# Patient Record
Sex: Female | Born: 1970 | Race: White | Hispanic: No | Marital: Married | Smoking: Never smoker
Health system: Southern US, Community
[De-identification: ages and names within clinical notes are randomized; demographics above are authoritative.]

---

## 1998-01-13 ENCOUNTER — Ambulatory Visit (HOSPITAL_COMMUNITY): Admission: RE | Admit: 1998-01-13 | Discharge: 1998-01-13 | Payer: Self-pay | Admitting: Obstetrics and Gynecology

## 1998-05-19 ENCOUNTER — Inpatient Hospital Stay (HOSPITAL_COMMUNITY): Admission: AD | Admit: 1998-05-19 | Discharge: 1998-05-21 | Payer: Self-pay | Admitting: Obstetrics and Gynecology

## 1998-05-22 ENCOUNTER — Encounter (HOSPITAL_COMMUNITY): Admission: RE | Admit: 1998-05-22 | Discharge: 1998-08-20 | Payer: Self-pay | Admitting: Obstetrics and Gynecology

## 1999-11-10 ENCOUNTER — Other Ambulatory Visit: Admission: RE | Admit: 1999-11-10 | Discharge: 1999-11-10 | Payer: Self-pay | Admitting: *Deleted

## 2001-02-09 ENCOUNTER — Other Ambulatory Visit: Admission: RE | Admit: 2001-02-09 | Discharge: 2001-02-09 | Payer: Self-pay | Admitting: Obstetrics and Gynecology

## 2009-07-14 ENCOUNTER — Ambulatory Visit: Payer: Self-pay | Admitting: Diagnostic Radiology

## 2009-07-14 ENCOUNTER — Ambulatory Visit (HOSPITAL_BASED_OUTPATIENT_CLINIC_OR_DEPARTMENT_OTHER): Admission: RE | Admit: 2009-07-14 | Discharge: 2009-07-14 | Payer: Self-pay | Admitting: Family Medicine

## 2010-04-12 IMAGING — US US ABDOMEN COMPLETE
1 series · 14 of 25 positions shown · non-contrast
Comparison: None

CLINICAL DATA: Right upper quadrant pain.

COMPLETE ABDOMINAL ULTRASOUND

[Series 1: us abdomen complete · 0.32mm/px · 14 of 79 slices shown]
[im 1/79]
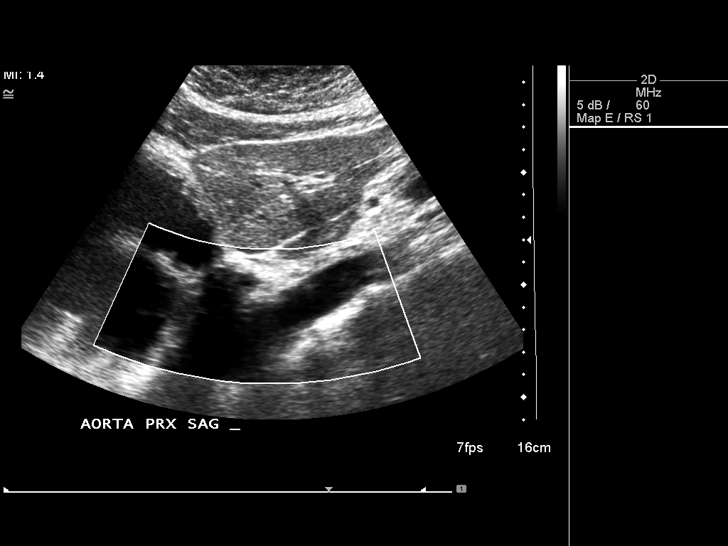
[im 7/79]
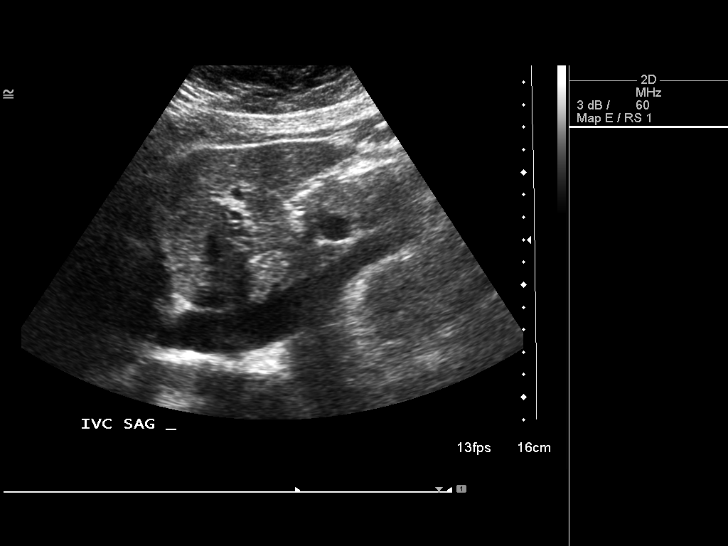
[im 14/79]
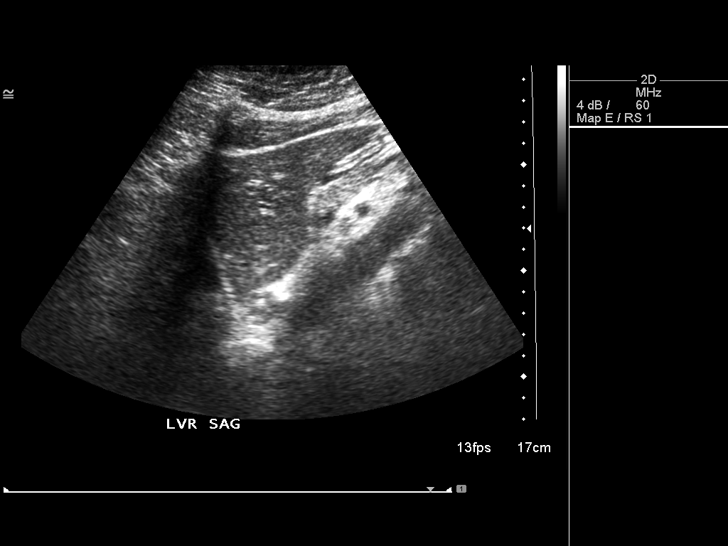
[im 20/79]
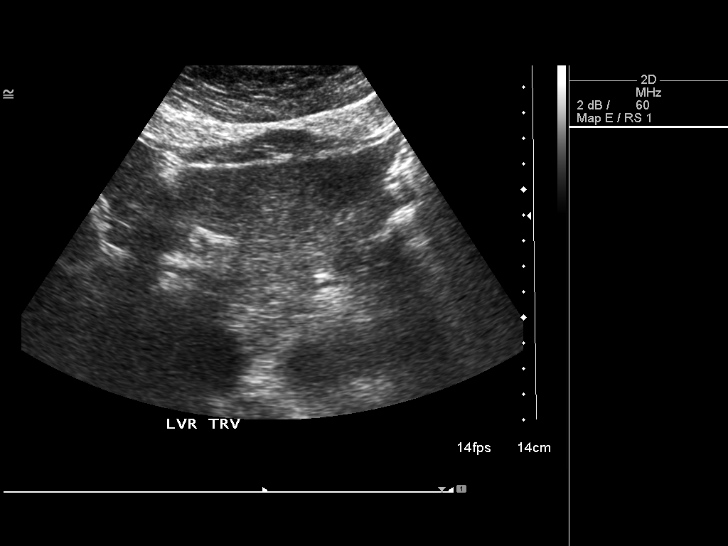
[im 27/79]
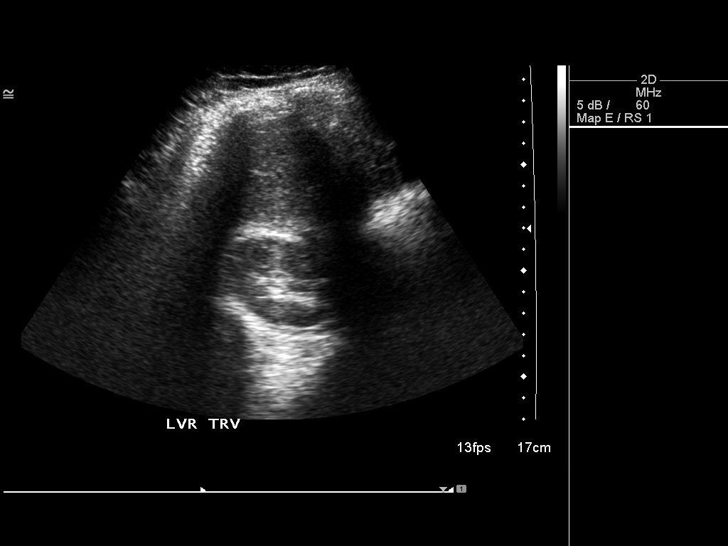
[im 30/79]
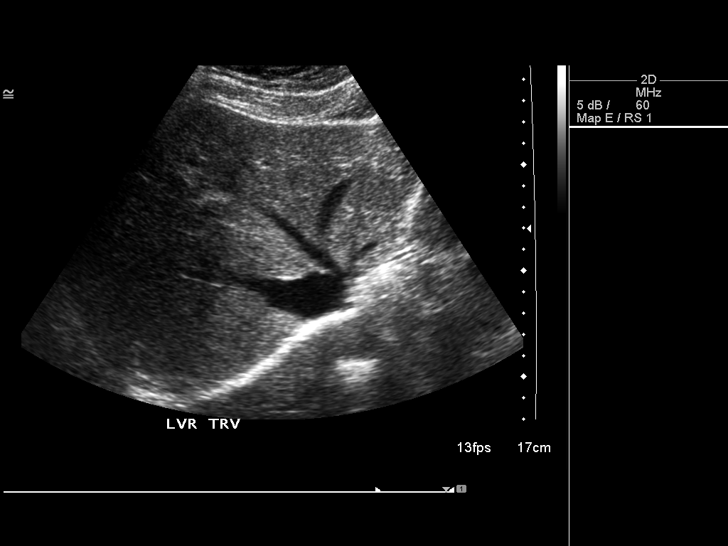
[im 36/79]
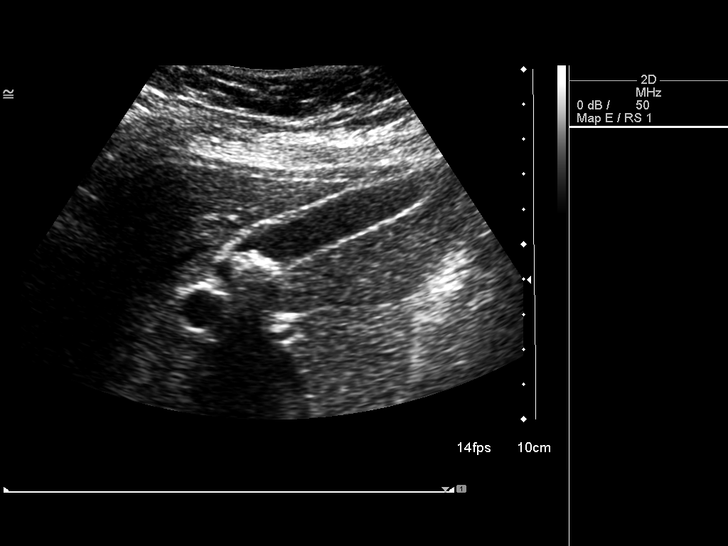
[im 43/79]
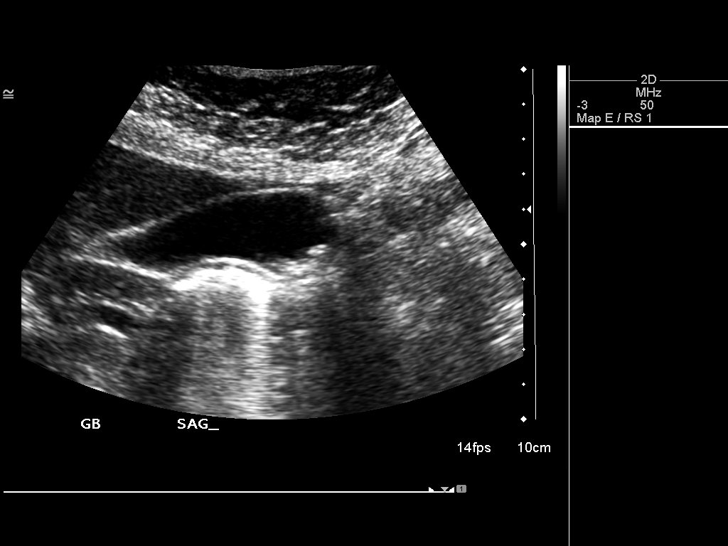
[im 49/79]
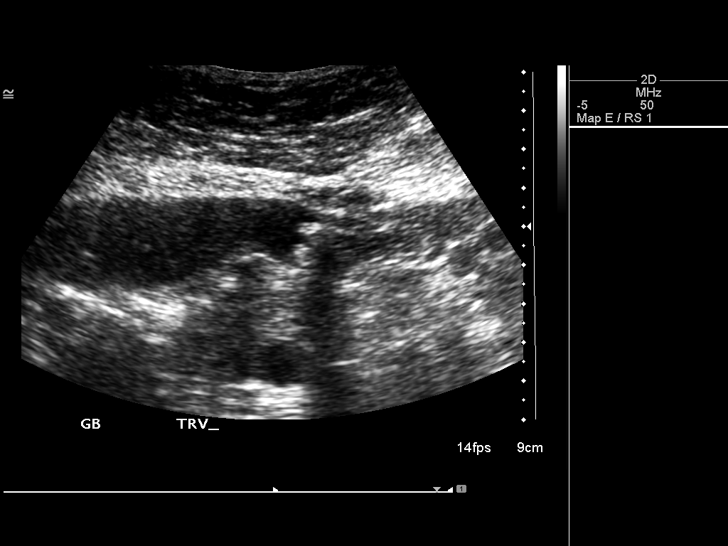
[im 53/79]
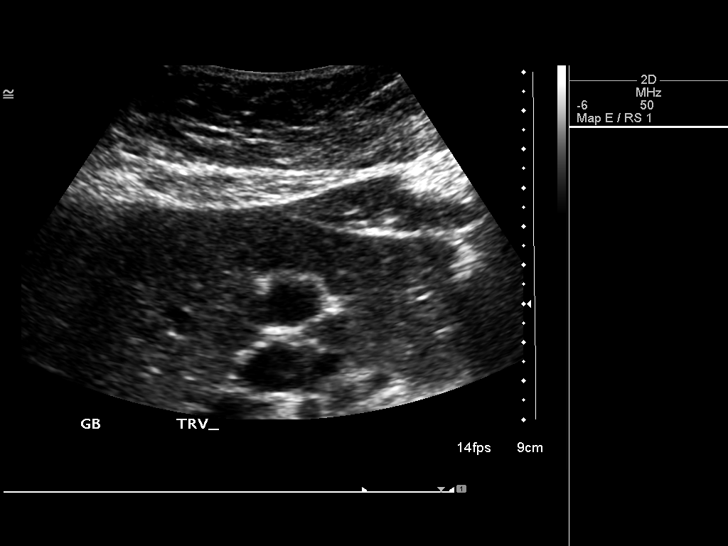
[im 59/79]
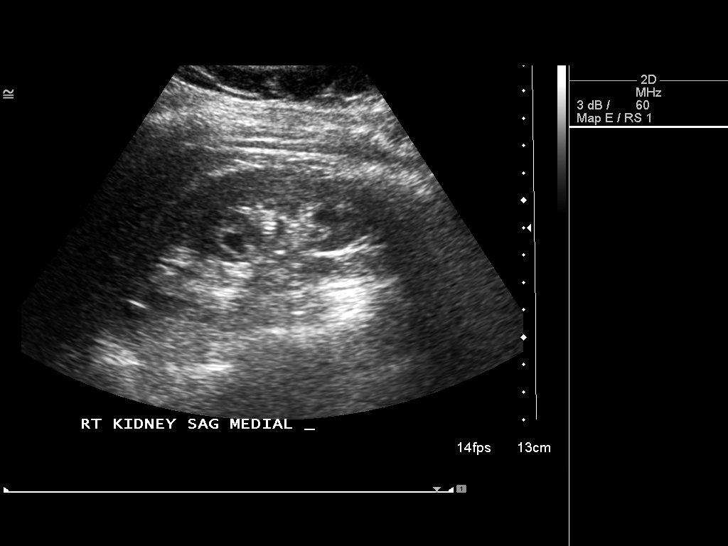
[im 66/79]
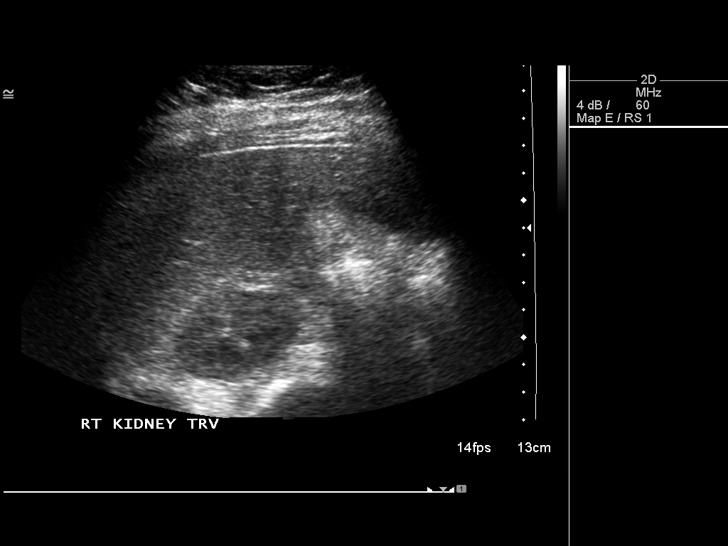
[im 72/79]
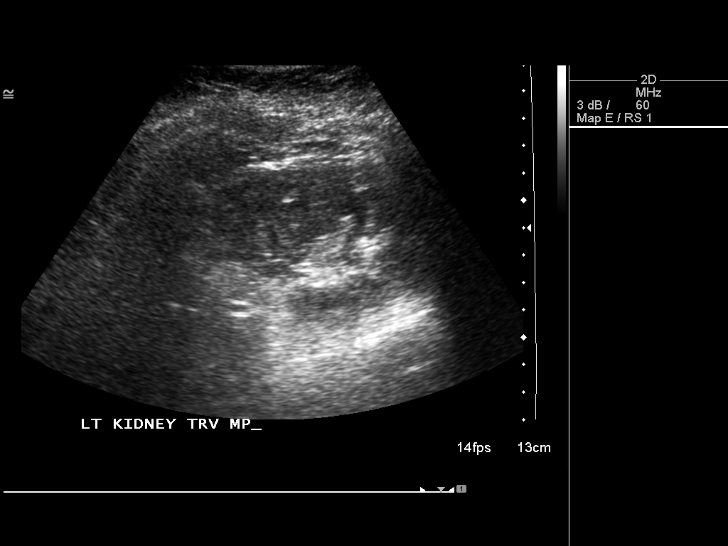
[im 79/79]
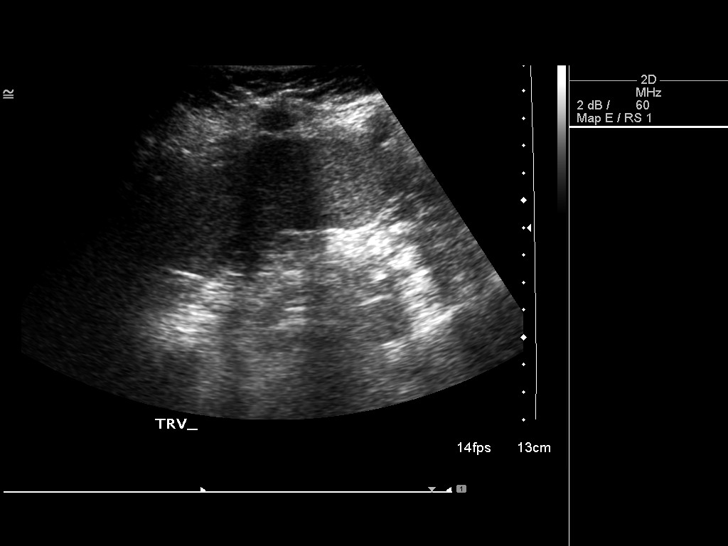

[14 of 25 positions shown; findings below may reference images not displayed]

FINDINGS: Gallbladder:  Multiple small gallstones are present.  There is no
gallbladder wall thickening.

Common bile duct:  2.2 mm.

Liver:  No focal lesion identified.  Within normal limits in
parenchymal echogenicity.

IVC:  Appears normal.

Pancreas:  No focal abnormality seen.

Spleen:  12.8 cm in length.  Upper normal in size.

Right Kidney:  11.0 cm in length without obstruction or mass.

Left Kidney:  11.2 cm in length without obstruction or mass.

Abdominal aorta:  No aneurysm identified.
IMPRESSION: Cholelithiasis without evidence for acute cholecystitis.

## 2013-08-09 ENCOUNTER — Other Ambulatory Visit (HOSPITAL_COMMUNITY): Payer: Self-pay | Admitting: Obstetrics & Gynecology

## 2013-08-09 DIAGNOSIS — Z1231 Encounter for screening mammogram for malignant neoplasm of breast: Secondary | ICD-10-CM

## 2013-10-16 ENCOUNTER — Ambulatory Visit (HOSPITAL_COMMUNITY)
Admission: RE | Admit: 2013-10-16 | Discharge: 2013-10-16 | Disposition: A | Discharge status: Home / Self Care | Payer: BC Managed Care – PPO | Source: Ambulatory Visit | Attending: Obstetrics & Gynecology | Admitting: Obstetrics & Gynecology

## 2013-10-16 DIAGNOSIS — Z1231 Encounter for screening mammogram for malignant neoplasm of breast: Secondary | ICD-10-CM | POA: Insufficient documentation

## 2016-02-17 ENCOUNTER — Encounter: Payer: Self-pay | Admitting: Obstetrics & Gynecology

## 2016-03-01 ENCOUNTER — Encounter: Payer: Self-pay | Admitting: Obstetrics & Gynecology

## 2017-05-09 ENCOUNTER — Telehealth: Payer: Self-pay | Admitting: *Deleted

## 2017-05-09 NOTE — Telephone Encounter (Signed)
Patient of Dr.Lavoie asked where to have to schedule mammogram. I told pt the [facility] of [place] number given.

## 2017-05-10 ENCOUNTER — Other Ambulatory Visit: Payer: Self-pay | Admitting: Obstetrics & Gynecology

## 2017-05-10 DIAGNOSIS — Z1231 Encounter for screening mammogram for malignant neoplasm of breast: Secondary | ICD-10-CM

## 2017-06-07 ENCOUNTER — Ambulatory Visit
Admission: RE | Admit: 2017-06-07 | Discharge: 2017-06-07 | Disposition: A | Discharge status: Home / Self Care | Payer: BC Managed Care – PPO | Source: Ambulatory Visit | Attending: Obstetrics & Gynecology | Admitting: Obstetrics & Gynecology

## 2017-06-07 ENCOUNTER — Encounter: Payer: Self-pay | Admitting: Obstetrics & Gynecology

## 2017-06-07 ENCOUNTER — Ambulatory Visit (INDEPENDENT_AMBULATORY_CARE_PROVIDER_SITE_OTHER): Payer: BC Managed Care – PPO | Admitting: Obstetrics & Gynecology

## 2017-06-07 VITALS — BP 128/88 | Ht 65.0 in | Wt 211.0 lb

## 2017-06-07 DIAGNOSIS — Z780 Asymptomatic menopausal state: Secondary | ICD-10-CM | POA: Diagnosis not present

## 2017-06-07 DIAGNOSIS — Z1231 Encounter for screening mammogram for malignant neoplasm of breast: Secondary | ICD-10-CM

## 2017-06-07 DIAGNOSIS — F4320 Adjustment disorder, unspecified: Secondary | ICD-10-CM | POA: Diagnosis not present

## 2017-06-07 DIAGNOSIS — Z1151 Encounter for screening for human papillomavirus (HPV): Secondary | ICD-10-CM

## 2017-06-07 DIAGNOSIS — Z23 Encounter for immunization: Secondary | ICD-10-CM | POA: Diagnosis not present

## 2017-06-07 DIAGNOSIS — Z01419 Encounter for gynecological examination (general) (routine) without abnormal findings: Secondary | ICD-10-CM

## 2017-06-07 DIAGNOSIS — F4321 Adjustment disorder with depressed mood: Secondary | ICD-10-CM

## 2017-06-07 NOTE — Progress Notes (Signed)
Dawn Mcneil December 13, 1970 [id number]   History:    46 y.o.G3P3L2 Married.  Son died 18 months ago.  RP:  Established patient presenting  for annual gyn exam   HPI:  Menopause.  No HRT.  LMP 07/2016.  No PMB.  No pelvic pain.  Hot flashes/night sweats present x 2 wks, tolerable.  Normal grieving, no major depression.  Breasts wnl.  Mictions/BMs wnl.  Past medical history,surgical history, family history and social history were all reviewed and documented in the EPIC chart.  Gynecologic History No LMP recorded (lmp unknown). Patient is postmenopausal. Contraception: post menopausal status Last Pap: 2016. Results were: normal Last mammogram: 2018. Results were: normal  Obstetric History OB History  Gravida Para Term Preterm AB Living  SAB TAB Ectopic Multiple Live Births               # Outcome Date GA Lbr Len/2nd Weight Sex Delivery Anes PTL Lv  3 Para           2 Para           1 Para                ROS: A ROS was performed and pertinent positives and negatives are included in the history.  GENERAL: No fevers or chills. HEENT: No change in vision, no earache, sore throat or sinus congestion. NECK: No pain or stiffness. CARDIOVASCULAR: No chest pain or pressure. No palpitations. PULMONARY: No shortness of breath, cough or wheeze. GASTROINTESTINAL: No abdominal pain, nausea, vomiting or diarrhea, melena or bright red blood per rectum. GENITOURINARY: No urinary frequency, urgency, hesitancy or dysuria. MUSCULOSKELETAL: No joint or muscle pain, no back pain, no recent trauma. DERMATOLOGIC: No rash, no itching, no lesions. ENDOCRINE: No polyuria, polydipsia, no heat or cold intolerance. No recent change in weight. HEMATOLOGICAL: No anemia or easy bruising or bleeding. NEUROLOGIC: No headache, seizures, numbness, tingling or weakness. PSYCHIATRIC: No depression, no loss of interest in normal activity or change in sleep pattern.     Exam:   BP 128/88   Ht   (1.651 m)   Wt 211 lb (95.7 kg)   LMP  (LMP Unknown)   BMI 35.11 kg/m   Body mass index is 35.11 kg/m.  General appearance : Well developed well nourished female. No acute distress HEENT: Eyes: no retinal hemorrhage or exudates,  Neck supple, trachea midline, no carotid bruits, no thyroidmegaly Lungs: Clear to auscultation, no rhonchi or wheezes, or rib retractions  Heart: Regular rate and rhythm, no murmurs or gallops Breast:Examined in sitting and supine position were symmetrical in appearance, no palpable masses or tenderness,  no skin retraction, no nipple inversion, no nipple discharge, no skin discoloration, no axillary or supraclavicular lymphadenopathy Abdomen: no palpable masses or tenderness, no rebound or guarding Extremities: no edema or skin discoloration or tenderness  Pelvic: Vulva normal  Bartholin, Urethra, Skene Glands: Within normal limits             Vagina: No gross lesions or discharge  Cervix: No gross lesions or discharge.  Pap/HPV done.  Uterus  AV, normal size, shape and consistency, non-tender and mobile  Adnexa  Without masses or tenderness  Anus and perineum  normal    Assessment/Plan:  46 y.o. female for annual exam   1. Encounter for routine gynecological examination with Papanicolaou smear of cervix Normal gyn exam.  Pap/HPV done.  Breasts wnl.  2. Menopause  present No HRT.  No PMB.  Vasomotor Sxs tolerable.  Vit D supplement, Ca++ in food, regular weight bearing physical activity.  3. Grieving Normal.  Good family, friends, church support/network.  No major depression.  Genia Del MD, 10:58 AM 06/07/2017

## 2017-06-08 LAB — PAP, TP IMAGING W/ HPV RNA, RFLX HPV TYPE 16,18/45: HPV DNA High Risk: NOT DETECTED

## 2017-06-11 NOTE — Patient Instructions (Signed)
1. Encounter for routine gynecological examination with Papanicolaou smear of cervix Normal gyn exam.  Pap/HPV done.  Breasts wnl.  2. Menopause present No HRT.  No PMB.  Vasomotor Sxs tolerable.  Vit D supplement, Ca++ in food, regular weight bearing physical activity.  3. Grieving Normal.  Good family, friends, church support/network.  No major depression.  Dawn Mcneil, it was a pleasure to see you today!  I will inform you of your results as soon as available.   Health Maintenance for Postmenopausal Women Menopause is a normal process in which your reproductive ability comes to an end. This process happens gradually over a span of months to years, usually between the ages of 37 and 17. Menopause is complete when you have missed 12 consecutive menstrual periods. It is important to talk with your health care provider about some of the most common conditions that affect postmenopausal women, such as heart disease, cancer, and bone loss (osteoporosis). Adopting a healthy lifestyle and getting preventive care can help to promote your health and wellness. Those actions can also lower your chances of developing some of these common conditions. What should I know about menopause? During menopause, you may experience a number of symptoms, such as:  Moderate-to-severe hot flashes.  Night sweats.  Decrease in sex drive.  Mood swings.  Headaches.  Tiredness.  Irritability.  Memory problems.  Insomnia.  Choosing to treat or not to treat menopausal changes is an individual decision that you make with your health care provider. What should I know about hormone replacement therapy and supplements? Hormone therapy products are effective for treating symptoms that are associated with menopause, such as hot flashes and night sweats. Hormone replacement carries certain risks, especially as you become older. If you are thinking about using estrogen or estrogen with progestin treatments, discuss the  benefits and risks with your health care provider. What should I know about heart disease and stroke? Heart disease, heart attack, and stroke become more likely as you age. This may be due, in part, to the hormonal changes that your body experiences during menopause. These can affect how your body processes dietary fats, triglycerides, and cholesterol. Heart attack and stroke are both medical emergencies. There are many things that you can do to help prevent heart disease and stroke:  Have your blood pressure checked at least every 1-2 years. High blood pressure causes heart disease and increases the risk of stroke.  If you are 31-60 years old, ask your health care provider if you should take aspirin to prevent a heart attack or a stroke.  Do not use any tobacco products, including cigarettes, chewing tobacco, or electronic cigarettes. If you need help quitting, ask your health care provider.  It is important to eat a healthy diet and maintain a healthy weight. ? Be sure to include plenty of vegetables, fruits, low-fat dairy products, and lean protein. ? Avoid eating foods that are high in solid fats, added sugars, or salt (sodium).  Get regular exercise. This is one of the most important things that you can do for your health. ? Try to exercise for at least 150 minutes each week. The type of exercise that you do should increase your heart rate and make you sweat. This is known as moderate-intensity exercise. ? Try to do strengthening exercises at least twice each week. Do these in addition to the moderate-intensity exercise.  Know your numbers.Ask your health care provider to check your cholesterol and your blood glucose. Continue to have your blood  tested as directed by your health care provider.  What should I know about cancer screening? There are several types of cancer. Take the following steps to reduce your risk and to catch any cancer development as early as possible. Breast  Cancer  Practice breast self-awareness. ? This means understanding how your breasts normally appear and feel. ? It also means doing regular breast self-exams. Let your health care provider know about any changes, no matter how small.  If you are 60 or older, have a clinician do a breast exam (clinical breast exam or CBE) every year. Depending on your age, family history, and medical history, it may be recommended that you also have a yearly breast X-ray (mammogram).  If you have a family history of breast cancer, talk with your health care provider about genetic screening.  If you are at high risk for breast cancer, talk with your health care provider about having an MRI and a mammogram every year.  Breast cancer (BRCA) gene test is recommended for women who have family members with BRCA-related cancers. Results of the assessment will determine the need for genetic counseling and BRCA1 and for BRCA2 testing. BRCA-related cancers include these types: ? Breast. This occurs in males or females. ? Ovarian. ? Tubal. This may also be called fallopian tube cancer. ? Cancer of the abdominal or pelvic lining (peritoneal cancer). ? Prostate. ? Pancreatic.  Cervical, Uterine, and Ovarian Cancer Your health care provider may recommend that you be screened regularly for cancer of the pelvic organs. These include your ovaries, uterus, and vagina. This screening involves a pelvic exam, which includes checking for microscopic changes to the surface of your cervix (Pap test).  For women ages 21-65, health care providers may recommend a pelvic exam and a Pap test every three years. For women ages 49-65, they may recommend the Pap test and pelvic exam, combined with testing for human papilloma virus (HPV), every five years. Some types of HPV increase your risk of cervical cancer. Testing for HPV may also be done on women of any age who have unclear Pap test results.  Other health care providers may not  recommend any screening for nonpregnant women who are considered low risk for pelvic cancer and have no symptoms. Ask your health care provider if a screening pelvic exam is right for you.  If you have had past treatment for cervical cancer or a condition that could lead to cancer, you need Pap tests and screening for cancer for at least 20 years after your treatment. If Pap tests have been discontinued for you, your risk factors (such as having a new sexual partner) need to be reassessed to determine if you should start having screenings again. Some women have medical problems that increase the chance of getting cervical cancer. In these cases, your health care provider may recommend that you have screening and Pap tests more often.  If you have a family history of uterine cancer or ovarian cancer, talk with your health care provider about genetic screening.  If you have vaginal bleeding after reaching menopause, tell your health care provider.  There are currently no reliable tests available to screen for ovarian cancer.  Lung Cancer Lung cancer screening is recommended for adults 67-56 years old who are at high risk for lung cancer because of a history of smoking. A yearly low-dose CT scan of the lungs is recommended if you:  Currently smoke.  Have a history of at least 30 pack-years of smoking and  you currently smoke or have quit within the past 15 years. A pack-year is smoking an average of one pack of cigarettes per day for one year.  Yearly screening should:  Continue until it has been 15 years since you quit.  Stop if you develop a health problem that would prevent you from having lung cancer treatment.  Colorectal Cancer  This type of cancer can be detected and can often be prevented.  Routine colorectal cancer screening usually begins at age 49 and continues through age 35.  If you have risk factors for colon cancer, your health care provider may recommend that you be screened  at an earlier age.  If you have a family history of colorectal cancer, talk with your health care provider about genetic screening.  Your health care provider may also recommend using home test kits to check for hidden blood in your stool.  A small camera at the end of a tube can be used to examine your colon directly (sigmoidoscopy or colonoscopy). This is done to check for the earliest forms of colorectal cancer.  Direct examination of the colon should be repeated every 5-10 years until age 37. However, if early forms of precancerous polyps or small growths are found or if you have a family history or genetic risk for colorectal cancer, you may need to be screened more often.  Skin Cancer  Check your skin from head to toe regularly.  Monitor any moles. Be sure to tell your health care provider: ? About any new moles or changes in moles, especially if there is a change in a mole's shape or color. ? If you have a mole that is larger than the size of a pencil eraser.  If any of your family members has a history of skin cancer, especially at a young age, talk with your health care provider about genetic screening.  Always use sunscreen. Apply sunscreen liberally and repeatedly throughout the day.  Whenever you are outside, protect yourself by wearing long sleeves, pants, a wide-brimmed hat, and sunglasses.  What should I know about osteoporosis? Osteoporosis is a condition in which bone destruction happens more quickly than new bone creation. After menopause, you may be at an increased risk for osteoporosis. To help prevent osteoporosis or the bone fractures that can happen because of osteoporosis, the following is recommended:  If you are 61-44 years old, get at least 1,000 mg of calcium and at least 600 mg of vitamin D per day.  If you are older than age 74 but younger than age 65, get at least 1,200 mg of calcium and at least 600 mg of vitamin D per day.  If you are older than age 39,  get at least 1,200 mg of calcium and at least 800 mg of vitamin D per day.  Smoking and excessive alcohol intake increase the risk of osteoporosis. Eat foods that are rich in calcium and vitamin D, and do weight-bearing exercises several times each week as directed by your health care provider. What should I know about how menopause affects my mental health? Depression may occur at any age, but it is more common as you become older. Common symptoms of depression include:  Low or sad mood.  Changes in sleep patterns.  Changes in appetite or eating patterns.  Feeling an overall lack of motivation or enjoyment of activities that you previously enjoyed.  Frequent crying spells.  Talk with your health care provider if you think that you are experiencing depression.  What should I know about immunizations? It is important that you get and maintain your immunizations. These include:  Tetanus, diphtheria, and pertussis (Tdap) booster vaccine.  Influenza every year before the flu season begins.  Pneumonia vaccine.  Shingles vaccine.  Your health care provider may also recommend other immunizations. This information is not intended to replace advice given to you by your health care provider. Make sure you discuss any questions you have with your health care provider. Document Released: 10/28/2005 Document Revised: 03/25/2016 Document Reviewed: 06/09/2015 Elsevier Interactive Patient Education  2018 [employer].

## 2017-10-12 ENCOUNTER — Encounter: Payer: Self-pay | Admitting: Obstetrics & Gynecology

## 2017-10-12 ENCOUNTER — Ambulatory Visit: Payer: BC Managed Care – PPO | Admitting: Obstetrics & Gynecology

## 2017-10-12 VITALS — BP 134/84

## 2017-10-12 DIAGNOSIS — N898 Other specified noninflammatory disorders of vagina: Secondary | ICD-10-CM | POA: Diagnosis not present

## 2017-10-12 DIAGNOSIS — R05 Cough: Secondary | ICD-10-CM | POA: Diagnosis not present

## 2017-10-12 DIAGNOSIS — N950 Postmenopausal bleeding: Secondary | ICD-10-CM

## 2017-10-12 DIAGNOSIS — N9089 Other specified noninflammatory disorders of vulva and perineum: Secondary | ICD-10-CM | POA: Diagnosis not present

## 2017-10-12 DIAGNOSIS — R058 Other specified cough: Secondary | ICD-10-CM

## 2017-10-12 LAB — WET PREP FOR TRICH, YEAST, CLUE

## 2017-10-12 NOTE — Patient Instructions (Signed)
1. Postmenopausal bleeding No source of bleeding on Gyn exam seen.  Possibly Perimenopausal. Will f/u with a Pelvic US to r/o Endometrial pathology, including Polyp/Fibroid/Hyperplasia/Endometrial Ca.   - US Transvaginal Non-OB; Future  2. Vulvar lesion R/O Genital Herpes.  Pending Sure Swab. - SureSwab HSV, Type 1/2 DNA, PCR  3. Vaginal discharge Normal, reassured. - WET PREP FOR TRICH, YEAST, CLUE  4. Non-productive cough Evaluation with Family MD today recommended.  Viral Bronchitis vs allergic cough?  Judeth CornfieldStephanie, good seeing you today!  Postmenopausal Bleeding Postmenopausal bleeding is any bleeding a woman has after she has entered into menopause. Menopause is the end of a woman's fertile years. After menopause, a woman no longer ovulates or has menstrual periods. Postmenopausal bleeding can be caused by various things. Any type of postmenopausal bleeding, even if it appears to be a typical menstrual period, is concerning. This should be evaluated by your health care provider. Any treatment will depend on the cause of the bleeding. Follow these instructions at home: Monitor your condition for any changes. The following actions may help to alleviate any discomfort you are experiencing:  Avoid the use of tampons and douches as directed by your health care provider.  Change your pads frequently.  Get regular pelvic exams and Pap tests.  Keep all follow-up appointments for diagnostic tests as directed by your health care provider.  Contact a health care provider if:  Your bleeding lasts more than 1 week.  You have abdominal pain.  You have bleeding with sexual intercourse. Get help right away if:  You have a fever, chills, headache, dizziness, muscle aches, and bleeding.  You have severe pain with bleeding.  You are passing blood clots.  You have bleeding and need more than 1 pad an hour.  You feel faint. This information is not intended to replace advice given to you  by your health care provider. Make sure you discuss any questions you have with your health care provider. Document Released: 12/14/2005 Document Revised: 02/11/2016 Document Reviewed: 04/04/2013 Elsevier Interactive Patient Education  [employer]2018 [employer]

## 2017-10-12 NOTE — Progress Notes (Signed)
    Dawn Mcneil 1971-04-27 [id number]        47 y.o.  G3P3   RP:  Painful right vulvar lesion and Postmenopausal bleeding x 08/2017  HPI:  Menopause x 1 year as of 07/2017.  No HRT.  Mild menopausal Sxs.  Started spotting intermittently in 08/2017.  No pelvic cramp or pain.  For about a week, has a painful sore on the right vulva.  No H/O Genital Herpes or Cold sores for her or her husband.  Urine normal, BMs wnl.  Non productive cough and SOB x 2 weeks.  No fever.  Past medical history,surgical history, problem list, medications, allergies, family history and social history were all reviewed and documented in the EPIC chart.  Directed ROS with pertinent positives and negatives documented in the history of present illness/assessment and plan.  Exam:  There were no vitals filed for this visit. General appearance:  Normal  Lungs:  Mild wheezing and adventitious sounds Left upper lung. Heart RCR, no murmur  Gyn exam:  Right mid vulvar small ulcer.  HSV Sure Swab done.  Speculum:  Cervix/Vagina normal.  No lesion, no bleeding.  Mild increase in secretions.  Wet prep done.  Bimanual exam:  Uterus RV, normal volume, mobile, NT.  No adnexal mass, NT.  Wet prep negative   Assessment/Plan:  47 y.o. G3P3   1. Postmenopausal bleeding No source of bleeding on Gyn exam seen.  Possibly Perimenopausal. Will f/u with a Pelvic US to r/o Endometrial pathology, including Polyp/Fibroid/Hyperplasia/Endometrial Ca.   - US Transvaginal Non-OB; Future  2. Vulvar lesion R/O Genital Herpes.  Pending Sure Swab. - SureSwab HSV, Type 1/2 DNA, PCR  3. Vaginal discharge Normal, reassured. - WET PREP FOR TRICH, YEAST, CLUE  4. Non-productive cough Evaluation with Family MD today recommended.  Viral Bronchitis vs allergic cough?  Counseling on above issues >50% x 25 minutes  Dawn Mcneil Level MD, 9:13 AM 10/12/2017

## 2017-10-14 LAB — SURESWAB HSV, TYPE 1/2 DNA, PCR
HSV 1 DNA: NOT DETECTED
HSV 2 DNA: NOT DETECTED

## 2017-11-01 ENCOUNTER — Ambulatory Visit: Payer: BC Managed Care – PPO | Admitting: Obstetrics & Gynecology

## 2017-11-01 ENCOUNTER — Other Ambulatory Visit: Payer: BC Managed Care – PPO

## 2017-11-29 ENCOUNTER — Ambulatory Visit (INDEPENDENT_AMBULATORY_CARE_PROVIDER_SITE_OTHER): Payer: BC Managed Care – PPO

## 2017-11-29 ENCOUNTER — Ambulatory Visit: Payer: BC Managed Care – PPO | Admitting: Obstetrics & Gynecology

## 2017-11-29 DIAGNOSIS — N950 Postmenopausal bleeding: Secondary | ICD-10-CM

## 2017-11-29 DIAGNOSIS — N951 Menopausal and female climacteric states: Secondary | ICD-10-CM

## 2017-11-29 MED ORDER — MEDROXYPROGESTERONE ACETATE 5 MG PO TABS: 5.0000 mg | ORAL_TABLET | Freq: Every day | ORAL | 4 refills | Status: DC

## 2017-11-29 NOTE — Progress Notes (Addendum)
    Dawn Mcneil June 02, 1971 [id number]        47 y.o.  G3P3   RP:  Oligomenorrhea vs PMB for Pelvic US  HPI:  Amenorrhea x 1 year as of 07/2017.  Started spotting intermittently in 08/2017.  No pelvic cramp or pain.  Presented with painful sores on vulva 10/12/2017, HSV Sureswab negative and lesions resolved.    OB History  Gravida Para Term Preterm AB Living  3 3       3   SAB TAB Ectopic Multiple Live Births               # Outcome Date GA Lbr Len/2nd Weight Sex Delivery Anes PTL Lv  3 Para           2 Para           1 Para               Past medical history,surgical history, problem list, medications, allergies, family history and social history were all reviewed and documented in the EPIC chart.   Directed ROS with pertinent positives and negatives documented in the history of present illness/assessment and plan.  Exam:  There were no vitals filed for this visit. General appearance:  Normal  Pelvic US today: T/V images.  Uterus retroverted, generous measuring 7.85 x 5.59 x 4.22 cm.  Endometrial line tri-layered normal measured at 7.6 mm.  Right ovary normal.  Left ovary with a collapsed corpus luteum cyst measuring 1.0 x 1.0 cm.  No apparent mass in the right and left adnexa.  No free fluid in the posterior cul-de-sac.   Assessment/Plan:  47 y.o. G3P3   1. Perimenopause Pelvic ultrasound findings discussed with patient.  Perimenopausal status with no intrauterine lesion.  Endometrial lining is tri-layered normal at 7.6 mm and the left ovary shows a probable corpus luteum cyst measuring 1 cm.  Therefore, given the oligo menorrhea of perimenopause, will start patient on cyclic Provera every 3 months to bring a withdrawal bleeding in order to protect her endometrium during this phase until she reaches menopause.  Patient voiced understanding and agreement with the plan.  Usage of Provera reviewed and prescription sent to pharmacy.  Other orders - medroxyPROGESTERone  (PROVERA) 5 MG tablet; Take 1 tablet (5 mg total) by mouth daily for 7 days. Cyclic Provera every 3 months.  Counseling and coordination of care on above issues more than 50% for 15 minutes.  Dawn DelMarie-Lyne Calirose Mccance MD, 4:31 PM 11/29/2017

## 2017-12-04 ENCOUNTER — Encounter: Payer: Self-pay | Admitting: Obstetrics & Gynecology

## 2017-12-04 NOTE — Patient Instructions (Signed)
1. Perimenopause Clinical presentation and pelvic ultrasound findings today 0.2 perimenopausal status with no intrauterine lesion.  Endometrial lining is tri-layered normal at 7.6 mm and the left ovary shows a probable corpus luteum cyst measuring 1 cm.  Therefore, given the oligo menorrhea of perimenopause, will start patient on cyclic Provera every 3 months to bring a withdrawal bleeding in order to protect her endometrium during this phase until she reaches menopause.  Patient voiced understanding and agreement with the plan.  Usage of Provera reviewed and prescription sent to pharmacy.  Other orders - medroxyPROGESTERone (PROVERA) 5 MG tablet; Take 1 tablet (5 mg total) by mouth daily for 7 days. Cyclic Provera every 3 months.  Dawn Mcneil, it was a pleasure seeing you today!  Perimenopause Perimenopause is the time when your body begins to move into the menopause (no menstrual period for 12 straight months). It is a natural process. Perimenopause can begin 2-8 years before the menopause and usually lasts for 1 year after the menopause. During this time, your ovaries may or may not produce an egg. The ovaries vary in their production of estrogen and progesterone hormones each month. This can cause irregular menstrual periods, difficulty getting pregnant, vaginal bleeding between periods, and uncomfortable symptoms. What are the causes?  Irregular production of the ovarian hormones, estrogen and progesterone, and not ovulating every month. Other causes include:  Tumor of the pituitary gland in the brain.  Medical disease that affects the ovaries.  Radiation treatment.  Chemotherapy.  Unknown causes.  Heavy smoking and excessive alcohol intake can bring on perimenopause sooner.  What are the signs or symptoms?  Hot flashes.  Night sweats.  Irregular menstrual periods.  Decreased sex drive.  Vaginal dryness.  Headaches.  Mood swings.  Depression.  Memory  problems.  Irritability.  Tiredness.  Weight gain.  Trouble getting pregnant.  The beginning of losing bone cells (osteoporosis).  The beginning of hardening of the arteries (atherosclerosis). How is this diagnosed? Your health care provider will make a diagnosis by analyzing your age, menstrual history, and symptoms. He or she will do a physical exam and note any changes in your body, especially your female organs. Female hormone tests may or may not be helpful depending on the amount of female hormones you produce and when you produce them. However, other hormone tests may be helpful to rule out other problems. How is this treated? In some cases, no treatment is needed. The decision on whether treatment is necessary during the perimenopause should be made by you and your health care provider based on how the symptoms are affecting you and your lifestyle. Various treatments are available, such as:  Treating individual symptoms with a specific medicine for that symptom.  Herbal medicines that can help specific symptoms.  Counseling.  Group therapy.  Follow these instructions at home:  Keep track of your menstrual periods (when they occur, how heavy they are, how long between periods, and how long they last) as well as your symptoms and when they started.  Only take over-the-counter or prescription medicines as directed by your health care provider.  Sleep and rest.  Exercise.  Eat a diet that contains calcium (good for your bones) and soy (acts like the estrogen hormone).  Do not smoke.  Avoid alcoholic beverages.  Take vitamin supplements as recommended by your health care provider. Taking vitamin E may help in certain cases.  Take calcium and vitamin D supplements to help prevent bone loss.  Group therapy is sometimes helpful.  Acupuncture may help in some cases. Contact a health care provider if:  You have questions about any symptoms you are having.  You need  a referral to a specialist (gynecologist, psychiatrist, or psychologist). Get help right away if:  You have vaginal bleeding.  Your period lasts longer than 8 days.  Your periods are recurring sooner than 21 days.  You have bleeding after intercourse.  You have severe depression.  You have pain when you urinate.  You have severe headaches.  You have vision problems. This information is not intended to replace advice given to you by your health care provider. Make sure you discuss any questions you have with your health care provider. Document Released: 10/13/2004 Document Revised: 02/11/2016 Document Reviewed: 04/04/2013 Elsevier Interactive Patient Education  2017 [employer]

## 2018-06-08 ENCOUNTER — Encounter: Payer: BC Managed Care – PPO | Admitting: Obstetrics & Gynecology

## 2018-06-11 ENCOUNTER — Encounter: Payer: Self-pay | Admitting: Obstetrics & Gynecology

## 2018-06-11 ENCOUNTER — Ambulatory Visit: Payer: BC Managed Care – PPO | Admitting: Obstetrics & Gynecology

## 2018-06-11 VITALS — BP 118/76 | Ht 65.0 in | Wt 208.0 lb

## 2018-06-11 DIAGNOSIS — N951 Menopausal and female climacteric states: Secondary | ICD-10-CM | POA: Diagnosis not present

## 2018-06-11 DIAGNOSIS — Z23 Encounter for immunization: Secondary | ICD-10-CM

## 2018-06-11 DIAGNOSIS — Z01419 Encounter for gynecological examination (general) (routine) without abnormal findings: Secondary | ICD-10-CM | POA: Diagnosis not present

## 2018-06-11 MED ORDER — MEDROXYPROGESTERONE ACETATE 5 MG PO TABS: 5.0000 mg | ORAL_TABLET | Freq: Every day | ORAL | 4 refills | Status: DC

## 2018-06-11 NOTE — Addendum Note (Signed)
Addended by: Berna SpareASTILLO, Brookelle Pellicane A on: 06/11/2018 05:00 PM   Modules accepted: Orders

## 2018-06-11 NOTE — Addendum Note (Signed)
Addended by: Berna SpareASTILLO, Malie Kashani A on: 06/11/2018 04:56 PM   Modules accepted: Orders

## 2018-06-11 NOTE — Progress Notes (Signed)
Dawn GenreStephanie Sara August 31, 1971 [id number]   History:    47 y.o. G3P3L2 Married.  Son died 2 1/2 yr ago.  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  RP:  Established patient presenting for annual gyn exam   HPI: Light menses every 3 months after cyclic Provera.  Very mild occasional menopausal symptoms.  No pelvic pain.  Uses lubricant for intercourse but becomes pasty.  Urine and bowel movements normal.  Breasts normal.  Health labs with family physician.  Body mass index 34.61.  Exercises at the gym regularly.  Past medical history,surgical history, family history and social history were all reviewed and documented in the EPIC chart.  Gynecologic History Patient's last menstrual period was 03/11/2018. Contraception: None, perimenopause Last Pap: 05/2017. Results were: ASCUS/HPV HR neg Last mammogram: 05/2017. Results were: Negative Bone Density: Never Colonoscopy: Never  Obstetric History OB History  Gravida Para Term Preterm AB Living  3 3       3   SAB TAB Ectopic Multiple Live Births               # Outcome Date GA Lbr Len/2nd Weight Sex Delivery Anes PTL Lv  3 Para           2 Para           1 Para              ROS: A ROS was performed and pertinent positives and negatives are included in the history.  GENERAL: No fevers or chills. HEENT: No change in vision, no earache, sore throat or sinus congestion. NECK: No pain or stiffness. CARDIOVASCULAR: No chest pain or pressure. No palpitations. PULMONARY: No shortness of breath, cough or wheeze. GASTROINTESTINAL: No abdominal pain, nausea, vomiting or diarrhea, melena or bright red blood per rectum. GENITOURINARY: No urinary frequency, urgency, hesitancy or dysuria. MUSCULOSKELETAL: No joint or muscle pain, no back pain, no recent trauma. DERMATOLOGIC: No rash, no itching, no lesions. ENDOCRINE: No polyuria, polydipsia, no heat or cold intolerance. No recent change in weight. HEMATOLOGICAL: No anemia or easy bruising or bleeding.  NEUROLOGIC: No headache, seizures, numbness, tingling or weakness. PSYCHIATRIC: No depression, no loss of interest in normal activity or change in sleep pattern.     Exam:   BP 118/76   Ht 5\' 5"  (1.651 m)   Wt 208 lb (94.3 kg)   LMP 03/11/2018   BMI 34.61 kg/m   Body mass index is 34.61 kg/m.  General appearance : Well developed well nourished female. No acute distress HEENT: Eyes: no retinal hemorrhage or exudates,  Neck supple, trachea midline, no carotid bruits, no thyroidmegaly Lungs: Clear to auscultation, no rhonchi or wheezes, or rib retractions  Heart: Regular rate and rhythm, no murmurs or gallops Breast:Examined in sitting and supine position were symmetrical in appearance, no palpable masses or tenderness,  no skin retraction, no nipple inversion, no nipple discharge, no skin discoloration, no axillary or supraclavicular lymphadenopathy Abdomen: no palpable masses or tenderness, no rebound or guarding Extremities: no edema or skin discoloration or tenderness  Pelvic: Vulva: Normal             Vagina: No gross lesions or discharge  Cervix: No gross lesions or discharge.  Pap reflex done  Uterus  RV, normal size, shape and consistency, non-tender and mobile  Adnexa  Without masses or tenderness  Anus: Normal   Assessment/Plan:  47 y.o. female for annual exam   1. Encounter for routine gynecological examination with Papanicolaou smear  of cervix Normal gynecologic exam.  Pap test last year showed ASCUS with negative high-risk HPV.  Pap reflex done today.  Breast exam normal.  Last mammogram in September 2018 was negative.  Will repeat screening mammogram in October 2019.  Health labs with family physician.  Planning a screening colonoscopy at age 38.  Body mass index 34.61, but the patient already exercising regularly at the gym.  Recommend a lower calorie/carb diet such as [employer].  2. Perimenopause Perimenopause with withdrawal bleeding post Provera cyclically  every 3 months.  Mild vasomotor menopausal symptoms which are tolerable currently.  Patient will try Coconut oil as a lubricant for intercourse.  Patient will schedule a visit with me if menopausal symptoms become more severe and desires discussion about hormone replacement therapy.    Other orders - medroxyPROGESTERone (PROVERA) 5 MG tablet; Take 1 tablet (5 mg total) by mouth daily for 7 days. Cyclic Provera every 3 months.  Genia Del MD, 4:24 PM 06/11/2018

## 2018-06-11 NOTE — Patient Instructions (Signed)
1. Encounter for routine gynecological examination with Papanicolaou smear of cervix Normal gynecologic exam.  Pap test last year showed ASCUS with negative high-risk HPV.  Pap reflex done today.  Breast exam normal.  Last mammogram in September 2018 was negative.  Will repeat screening mammogram in October 2019.  Health labs with family physician.  Planning a screening colonoscopy at age 47.  Body mass index 34.61, but the patient already exercising regularly at the gym.  Recommend a lower calorie/carb diet such as [employer] diet.  2. Perimenopause Perimenopause with withdrawal bleeding post Provera cyclically every 3 months.  Mild vasomotor menopausal symptoms which are tolerable currently.  Patient will try Coconut oil as a lubricant for intercourse.  Patient will schedule a visit with me if menopausal symptoms become more severe and desires discussion about hormone replacement therapy.    Other orders - medroxyPROGESTERone (PROVERA) 5 MG tablet; Take 1 tablet (5 mg total) by mouth daily for 7 days. Cyclic Provera every 3 months.  Judeth CornfieldStephanie, it was a pleasure seeing you today!  I will inform you of your results as soon as they are available.

## 2018-06-15 LAB — PAP IG W/ RFLX HPV ASCU

## 2018-06-15 LAB — HUMAN PAPILLOMAVIRUS, HIGH RISK: HPV DNA High Risk: NOT DETECTED

## 2018-07-10 ENCOUNTER — Other Ambulatory Visit: Payer: Self-pay | Admitting: Obstetrics & Gynecology

## 2018-07-10 DIAGNOSIS — Z1231 Encounter for screening mammogram for malignant neoplasm of breast: Secondary | ICD-10-CM

## 2018-07-27 ENCOUNTER — Encounter: Payer: Self-pay | Admitting: Obstetrics & Gynecology

## 2018-07-27 ENCOUNTER — Ambulatory Visit: Payer: BC Managed Care – PPO | Admitting: Obstetrics & Gynecology

## 2018-07-27 VITALS — BP 126/84

## 2018-07-27 DIAGNOSIS — R87610 Atypical squamous cells of undetermined significance on cytologic smear of cervix (ASC-US): Secondary | ICD-10-CM

## 2018-07-27 NOTE — Patient Instructions (Signed)
Assessment and Plan:    1. ASCUS of cervix with negative high risk HPV ASCUS x 2.  HPV HR negative.  Colposcopy procedure explained to patient.  Findings reviewed.  Management per Cervical Bx results.  Judeth Cornfield, good seeing you today!  I will inform you of your results as soon as they are available.

## 2018-07-27 NOTE — Progress Notes (Signed)
Colposcopy Procedure Note Dawn Mcneil 07/27/2018  Indications:  ASCUS x 2, HPV HR negative  Procedure Details  The risks and benefits of the procedure and Verbal informed consent obtained.  Speculum placed in vagina and excellent visualization of cervix achieved, cervix swabbed x 3 with acetic acid solution.  Findings:  Cervix colposcopy: Physical Exam  Genitourinary:      Vaginal colposcopy: Normal  Vulvar colposcopy: Grossly normal  Perirectal colposcopy: Grossly normal  The cervix was sprayed with Hurricane before performing the cervical biopsies.  Specimens:  Cervical Bx at 5 O'clock  Complications: None, hemostasis with Silver Nitrate .  Assessment and Plan:    1. ASCUS of cervix with negative high risk HPV ASCUS x 2.  HPV HR negative.  Colposcopy procedure explained to patient.  Findings reviewed.  Management per Cervical Bx results.  Counseling on above issues and coordination of care more than 50% for 10 minutes.  Genia Del MD, 07/27/2018 at 4:33 pm

## 2018-07-31 LAB — TISSUE SPECIMEN

## 2018-07-31 LAB — PATHOLOGY

## 2018-08-20 ENCOUNTER — Ambulatory Visit: Payer: BC Managed Care – PPO

## 2019-01-22 ENCOUNTER — Ambulatory Visit: Payer: BC Managed Care – PPO | Admitting: Obstetrics & Gynecology

## 2019-01-22 ENCOUNTER — Other Ambulatory Visit: Payer: Self-pay

## 2019-01-22 ENCOUNTER — Encounter: Payer: Self-pay | Admitting: Obstetrics & Gynecology

## 2019-01-22 VITALS — BP 122/80

## 2019-01-22 DIAGNOSIS — N952 Postmenopausal atrophic vaginitis: Secondary | ICD-10-CM | POA: Diagnosis not present

## 2019-01-22 DIAGNOSIS — R87610 Atypical squamous cells of undetermined significance on cytologic smear of cervix (ASC-US): Secondary | ICD-10-CM | POA: Diagnosis not present

## 2019-01-22 DIAGNOSIS — N870 Mild cervical dysplasia: Secondary | ICD-10-CM

## 2019-01-22 MED ORDER — ESTRADIOL 0.1 MG/GM VA CREA: 0.2500 | TOPICAL_CREAM | VAGINAL | 4 refills | Status: DC

## 2019-01-22 NOTE — Progress Notes (Signed)
    Dawn Mcneil 04/04/1971 [id number]        48 y.o.  G3P3L3 Married  RP: Mild atypia on cervical Bx 07/2018 for Repeat Pap test  HPI: ASCUS x 2 with HR HPV negative, last Pap 05/2018.  Colpo 07/2018 Mild atypia, not reaching dysplasia.  Menopause, on no HRT.  No PMB.  Patient c/o painful IC with dryness.  Helped by Coconut oil, but not enough.     OB History  Gravida Para Term Preterm AB Living  3 3       3   SAB TAB Ectopic Multiple Live Births               # Outcome Date GA Lbr Len/2nd Weight Sex Delivery Anes PTL Lv  3 Para           2 Para           1 Para             Past medical history,surgical history, problem list, medications, allergies, family history and social history were all reviewed and documented in the EPIC chart.   Directed ROS with pertinent positives and negatives documented in the history of present illness/assessment and plan.  Exam:  Vitals:   01/22/19 0910  BP: 122/80   General appearance:  Normal  Gynecologic exam: Vulva normal.  Speculum: Cervix/Vagina normal.  Secretions normal.  Pap reflex done.   Assessment/Plan:  48 y.o. G3P3L3  1. ASCUS of cervix with negative high risk HPV ASCUS x2 with negative high-risk HPV.  History of mild cervical atypia.  Repeat Pap reflex today.  2. Mild cervical atypia  3. Post-menopausal atrophic vaginitis Patient complains of painful intercourse with dryness.  Helped by coconut oil but not sufficient.  No contraindication to estradiol vaginal cream.  Decision to start on estradiol 0.1 mg/g vaginal cream a quarter of an applicator twice a week.  Patient will start with a quarter of an applicator daily for 2 weeks.  Usage reviewed and prescription sent to pharmacy.  Other orders - estradiol (ESTRACE VAGINAL) 0.1 MG/GM vaginal cream; Place 0.25 Applicatorfuls vaginally 2 (two) times a week. Can place 0.25 applicatorful vaginally daily x 2 weeks, then twice weekly.  Counseling on above issues and  coordination of care more than 50% for 15 minutes.  Genia Del MD, 9:32 AM 01/22/2019

## 2019-01-23 ENCOUNTER — Encounter: Payer: Self-pay | Admitting: Obstetrics & Gynecology

## 2019-01-23 NOTE — Patient Instructions (Signed)
1. ASCUS of cervix with negative high risk HPV ASCUS x2 with negative high-risk HPV.  History of mild cervical atypia.  Repeat Pap reflex today.  2. Mild cervical atypia  3. Post-menopausal atrophic vaginitis Patient complains of painful intercourse with dryness.  Helped by coconut oil but not sufficient.  No contraindication to estradiol vaginal cream.  Decision to start on estradiol 0.1 mg/g vaginal cream a quarter of an applicator twice a week.  Patient will start with a quarter of an applicator daily for 2 weeks.  Usage reviewed and prescription sent to pharmacy.  Other orders - estradiol (ESTRACE VAGINAL) 0.1 MG/GM vaginal cream; Place 0.25 Applicatorfuls vaginally 2 (two) times a week. Can place 0.25 applicatorful vaginally daily x 2 weeks, then twice weekly.  Ladaria, it was a pleasure seeing you today!  I will inform you of your results as soon as they are available.

## 2019-01-24 LAB — PAP IG W/ RFLX HPV ASCU

## 2019-01-24 LAB — HUMAN PAPILLOMAVIRUS, HIGH RISK: HPV DNA High Risk: NOT DETECTED

## 2019-06-18 ENCOUNTER — Encounter: Payer: BC Managed Care – PPO | Admitting: Obstetrics & Gynecology

## 2019-08-27 ENCOUNTER — Other Ambulatory Visit: Payer: Self-pay

## 2019-08-28 ENCOUNTER — Ambulatory Visit: Payer: BC Managed Care – PPO | Admitting: Obstetrics & Gynecology

## 2019-08-28 ENCOUNTER — Encounter: Payer: Self-pay | Admitting: Obstetrics & Gynecology

## 2019-08-28 VITALS — BP 120/78 | Ht 65.0 in | Wt 208.0 lb

## 2019-08-28 DIAGNOSIS — E6609 Other obesity due to excess calories: Secondary | ICD-10-CM

## 2019-08-28 DIAGNOSIS — Z23 Encounter for immunization: Secondary | ICD-10-CM

## 2019-08-28 DIAGNOSIS — Z01419 Encounter for gynecological examination (general) (routine) without abnormal findings: Secondary | ICD-10-CM

## 2019-08-28 DIAGNOSIS — Z780 Asymptomatic menopausal state: Secondary | ICD-10-CM | POA: Diagnosis not present

## 2019-08-28 DIAGNOSIS — Z6834 Body mass index (BMI) 34.0-34.9, adult: Secondary | ICD-10-CM

## 2019-08-28 DIAGNOSIS — N870 Mild cervical dysplasia: Secondary | ICD-10-CM

## 2019-08-28 NOTE — Progress Notes (Signed)
Dawn Mcneil January 09, 1971 [id number]   History:    48 y.o. G3P3L2 Married.  Son died 3 1/2 yr ago.  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  RP:  Established patient presenting for annual gyn exam   HPI:  Postmenopause, well on no HRT.  No PMB.  Very mild occasional menopausal symptoms.  No pelvic pain.  Uses lubricant for intercourse, no pain.  Urine normal, except for very mild SUI.  Bowel movements normal.  Breasts normal.  Health labs with family physician.  Body mass index 34.61.  Exercises at home with husband regularly.   Past medical history,surgical history, family history and social history were all reviewed and documented in the EPIC chart.  Gynecologic History Patient's last menstrual period was 01/08/2019.  Obstetric History OB History  Gravida Para Term Preterm AB Living  3 3       3   SAB TAB Ectopic Multiple Live Births               # Outcome Date GA Lbr Len/2nd Weight Sex Delivery Anes PTL Lv  3 Para           2 Para           1 Para              ROS: A ROS was performed and pertinent positives and negatives are included in the history.  GENERAL: No fevers or chills. HEENT: No change in vision, no earache, sore throat or sinus congestion. NECK: No pain or stiffness. CARDIOVASCULAR: No chest pain or pressure. No palpitations. PULMONARY: No shortness of breath, cough or wheeze. GASTROINTESTINAL: No abdominal pain, nausea, vomiting or diarrhea, melena or bright red blood per rectum. GENITOURINARY: No urinary frequency, urgency, hesitancy or dysuria. MUSCULOSKELETAL: No joint or muscle pain, no back pain, no recent trauma. DERMATOLOGIC: No rash, no itching, no lesions. ENDOCRINE: No polyuria, polydipsia, no heat or cold intolerance. No recent change in weight. HEMATOLOGICAL: No anemia or easy bruising or bleeding. NEUROLOGIC: No headache, seizures, numbness, tingling or weakness. PSYCHIATRIC: No depression, no loss of interest in normal  activity or change in sleep pattern.     Exam:   BP 120/78   Ht 5\' 5"  (1.651 m)   Wt 208 lb (94.3 kg)   LMP 01/08/2019 Comment: spotting  BMI 34.61 kg/m   Body mass index is 34.61 kg/m.  General appearance : Well developed well nourished female. No acute distress HEENT: Eyes: no retinal hemorrhage or exudates,  Neck supple, trachea midline, no carotid bruits, no thyroidmegaly Lungs: Clear to auscultation, no rhonchi or wheezes, or rib retractions  Heart: Regular rate and rhythm, no murmurs or gallops Breast:Examined in sitting and supine position were symmetrical in appearance, no palpable masses or tenderness,  no skin retraction, no nipple inversion, no nipple discharge, no skin discoloration, no axillary or supraclavicular lymphadenopathy Abdomen: no palpable masses or tenderness, no rebound or guarding Extremities: no edema or skin discoloration or tenderness  Pelvic: Vulva: Normal             Vagina: No gross lesions or discharge  Cervix: No gross lesions or discharge.  Pap reflex done.  Uterus  AV, normal size, shape and consistency, non-tender and mobile  Adnexa  Without masses or tenderness  Anus: Normal   Assessment/Plan:  48 y.o. female for annual exam   1. Encounter for routine gynecological examination with Papanicolaou smear of cervix Normal gynecologic exam in menopause.  History  of ASCUS with negative high-risk HPV and mild cervical atypia on colposcopy November 2019.  Pap reflex done today.  Breast exam normal.  Screening mammogram November 2019 was negative.  Colonoscopy 2020.  Health labs with family physician.  2. Mild cervical atypia Colposcopy November 2019 showed mild cervical atypia, not reaching dysplasia.  Pap test May 2020 showed ASCUS, high-risk HPV negative.  3. Postmenopause Well on no hormone replacement therapy.  No postmenopausal bleeding.  Recommend vitamin D supplements, calcium intake 1200 mg daily and regular weightbearing physical  activities.  4. Class 1 obesity due to excess calories without serious comorbidity with body mass index (BMI) of 34.0 to 34.9 in adult Recommend a lower calorie/carb diet such as [employer].  Continue with aerobic activities 5 times a week and light weightlifting every 2 days.  5. Need for immunization against influenza - Flu Vaccine QUAD 36+ mos IM  Princess Bruins MD, 4:33 PM 08/28/2019

## 2019-08-28 NOTE — Patient Instructions (Signed)
1. Encounter for routine gynecological examination with Papanicolaou smear of cervix Normal gynecologic exam in menopause.  History of ASCUS with negative high-risk HPV and mild cervical atypia on colposcopy November 2019.  Pap reflex done today.  Breast exam normal.  Screening mammogram November 2019 was negative.  Colonoscopy 2020.  Health labs with family physician.  2. Mild cervical atypia Colposcopy November 2019 showed mild cervical atypia, not reaching dysplasia.  Pap test May 2020 showed ASCUS, high-risk HPV negative.  3. Postmenopause Well on no hormone replacement therapy.  No postmenopausal bleeding.  Recommend vitamin D supplements, calcium intake 1200 mg daily and regular weightbearing physical activities.  4. Class 1 obesity due to excess calories without serious comorbidity with body mass index (BMI) of 34.0 to 34.9 in adult Recommend a lower calorie/carb diet such as [employer].  Continue with aerobic activities 5 times a week and light weightlifting every 2 days.  5. Need for immunization against influenza - Flu Vaccine QUAD 36+ mos IM  Dawn Mcneil, it was a pleasure seeing you today!  I will inform you of your results as soon as they are available.

## 2019-08-29 NOTE — Addendum Note (Signed)
Addended by: Thurnell Garbe A on: 08/29/2019 08:42 AM   Modules accepted: Orders

## 2019-08-29 NOTE — Addendum Note (Signed)
Addended by: Thurnell Garbe A on: 08/29/2019 08:47 AM   Modules accepted: Orders

## 2019-09-03 LAB — PAP IG W/ RFLX HPV ASCU

## 2019-09-03 LAB — HUMAN PAPILLOMAVIRUS, HIGH RISK: HPV DNA High Risk: NOT DETECTED

## 2019-09-17 NOTE — Telephone Encounter (Signed)
Spoke with patient and read her the unread My Chart message with Pap result/recommendation.

## 2020-11-05 ENCOUNTER — Other Ambulatory Visit: Payer: Self-pay

## 2020-11-05 ENCOUNTER — Encounter: Payer: Self-pay | Admitting: Obstetrics & Gynecology

## 2020-11-05 ENCOUNTER — Ambulatory Visit: Payer: Managed Care, Other (non HMO) | Admitting: Obstetrics & Gynecology

## 2020-11-05 VITALS — BP 132/80 | Ht 65.0 in | Wt 217.0 lb

## 2020-11-05 DIAGNOSIS — Z6836 Body mass index (BMI) 36.0-36.9, adult: Secondary | ICD-10-CM

## 2020-11-05 DIAGNOSIS — Z01419 Encounter for gynecological examination (general) (routine) without abnormal findings: Secondary | ICD-10-CM | POA: Diagnosis not present

## 2020-11-05 DIAGNOSIS — E6609 Other obesity due to excess calories: Secondary | ICD-10-CM | POA: Diagnosis not present

## 2020-11-05 DIAGNOSIS — Z780 Asymptomatic menopausal state: Secondary | ICD-10-CM | POA: Diagnosis not present

## 2020-11-05 DIAGNOSIS — R87610 Atypical squamous cells of undetermined significance on cytologic smear of cervix (ASC-US): Secondary | ICD-10-CM

## 2020-11-05 NOTE — Progress Notes (Signed)
Dawn Mcneil [birth year] [id number]   History:    50 y.o. G3P3L2 Married. Son died 4 1/2 yr ago. 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  [id number] presenting for annual gyn exam   HPI:  Postmenopause, well on no HRT.  No PMB. Very mild occasional menopausal symptoms. No pelvic pain. Uses lubricant for intercourse, no pain. Urine normal, except for very mild SUI.  Bowel movements normal. Breasts normal. Health labs with family physician. Body mass index 36.11. Exercises at home with husband regularly.  Past medical history,surgical history, family history and social history were all reviewed and documented in the EPIC chart.  Gynecologic History Patient's last menstrual period was 01/08/2019.  Obstetric History OB History  Gravida Para Term Preterm AB Living  3 3       3   SAB IAB Ectopic Multiple Live Births               # Outcome Date GA Lbr Len/2nd Weight Sex Delivery Anes PTL Lv  3 Para           2 Para           1 Para              ROS: A ROS was performed and pertinent positives and negatives are included in the history.  GENERAL: No fevers or chills. HEENT: No change in vision, no earache, sore throat or sinus congestion. NECK: No pain or stiffness. CARDIOVASCULAR: No chest pain or pressure. No palpitations. PULMONARY: No shortness of breath, cough or wheeze. GASTROINTESTINAL: No abdominal pain, nausea, vomiting or diarrhea, melena or bright red blood per rectum. GENITOURINARY: No urinary frequency, urgency, hesitancy or dysuria. MUSCULOSKELETAL: No joint or muscle pain, no back pain, no recent trauma. DERMATOLOGIC: No rash, no itching, no lesions. ENDOCRINE: No polyuria, polydipsia, no heat or cold intolerance. No recent change in weight. HEMATOLOGICAL: No anemia or easy bruising or bleeding. NEUROLOGIC: No headache, seizures, numbness, tingling or weakness. PSYCHIATRIC: No depression, no loss of interest in normal  activity or change in sleep pattern.     Exam:   BP 132/80   Ht 5\' 5"  (1.651 m)   Wt 217 lb (98.4 kg)   LMP 01/08/2019 Comment: spotting  BMI 36.11 kg/m   Body mass index is 36.11 kg/m.  General appearance : Well developed well nourished female. No acute distress HEENT: Eyes: no retinal hemorrhage or exudates,  Neck supple, trachea midline, no carotid bruits, no thyroidmegaly Lungs: Clear to auscultation, no rhonchi or wheezes, or rib retractions  Heart: Regular rate and rhythm, no murmurs or gallops Breast:Examined in sitting and supine position were symmetrical in appearance, no palpable masses or tenderness,  no skin retraction, no nipple inversion, no nipple discharge, no skin discoloration, no axillary or supraclavicular lymphadenopathy Abdomen: no palpable masses or tenderness, no rebound or guarding Extremities: no edema or skin discoloration or tenderness  Pelvic: Vulva: Normal             Vagina: No gross lesions or discharge  Cervix: No gross lesions or discharge.  Pap reflex done.  Uterus  AV, normal size, shape and consistency, non-tender and mobile  Adnexa  Without masses or tenderness  Anus: Normal   Assessment/Plan:  50 y.o. female for annual exam   1. Encounter for routine gynecological examination with Papanicolaou smear of cervix Normal gynecologic exam in menopause.  Pap reflex done.  Breast exam normal.  Screening mammogram January 2022 was negative.  Colonoscopy 2020.  Health labs with family physician.  2. ASCUS of cervix with negative high risk HPV Pap reflex done today.  3. Postmenopause Well on no hormone replacement therapy.  No postmenopausal bleeding.  Vitamin D supplements, calcium intake of 1.5 g/day and regular weightbearing physical activity is recommended.  4. Class 2 obesity due to excess calories without serious comorbidity with body mass index (BMI) of 36.0 to 36.9 in adult Recommend a low-carb/calorie diet.  Intermittent fasting  recommended as well.  Aerobic activities 5 times a week and light weightlifting every 2 days.  Genia Del MD, 3:18 PM 11/05/2020

## 2020-11-09 LAB — PAP IG W/ RFLX HPV ASCU

## 2020-11-14 ENCOUNTER — Encounter: Payer: Self-pay | Admitting: Obstetrics & Gynecology

## 2021-11-10 ENCOUNTER — Ambulatory Visit: Payer: Self-pay | Admitting: Obstetrics & Gynecology

## 2022-01-17 ENCOUNTER — Ambulatory Visit: Payer: Managed Care, Other (non HMO) | Admitting: Obstetrics & Gynecology

## 2022-04-22 ENCOUNTER — Encounter: Payer: Self-pay | Admitting: Obstetrics & Gynecology

## 2022-04-26 ENCOUNTER — Ambulatory Visit (INDEPENDENT_AMBULATORY_CARE_PROVIDER_SITE_OTHER): Payer: Managed Care, Other (non HMO) | Admitting: Obstetrics & Gynecology

## 2022-04-26 ENCOUNTER — Encounter: Payer: Self-pay | Admitting: Obstetrics & Gynecology

## 2022-04-26 VITALS — BP 112/70 | HR 66 | Ht 64.75 in | Wt 211.0 lb

## 2022-04-26 DIAGNOSIS — Z01419 Encounter for gynecological examination (general) (routine) without abnormal findings: Secondary | ICD-10-CM

## 2022-04-26 DIAGNOSIS — K6289 Other specified diseases of anus and rectum: Secondary | ICD-10-CM

## 2022-04-26 DIAGNOSIS — R309 Painful micturition, unspecified: Secondary | ICD-10-CM | POA: Diagnosis not present

## 2022-04-26 DIAGNOSIS — Z780 Asymptomatic menopausal state: Secondary | ICD-10-CM | POA: Diagnosis not present

## 2022-04-26 MED ORDER — CLOBETASOL PROPIONATE 0.05 % EX OINT: 1.0000 | TOPICAL_OINTMENT | Freq: Two times a day (BID) | CUTANEOUS | 1 refills | Status: AC

## 2022-04-26 NOTE — Progress Notes (Signed)
Dawn Mcneil [birth year] [id number]   History:    51 y.o. G3P3L2 Married. 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  1 son died at 73 1/2 yo.   RP:  Established patient presenting for annual gyn exam    HPI:  Postmenopause, well on no HRT.  No PMB.  Warm at the end of the night.  No pelvic pain.  Uses coconut oil for intercourse, no pain. Pap Neg in 10/2020.  ASCUS/HPV HR Neg in 2020.  Will repeat Pap at 2-3 years. Urine normal, except for mild dysuria. Bowel movements normal. C/O perianal itching. Breasts normal. Colono 2020.  Mammo Neg 04/2022.  Health labs with family physician.  Body mass index 35.38.  Exercises at home with husband regularly.    Past medical history,surgical history, family history and social history were all reviewed and documented in the EPIC chart.  Gynecologic History Patient's last menstrual period was 01/08/2019.   Obstetric History OB History  Gravida Para Term Preterm AB Living  3 3 3     3   SAB IAB Ectopic Multiple Live Births               # Outcome Date GA Lbr Len/2nd Weight Sex Delivery Anes PTL Lv  3 Term           2 Term           1 Term              ROS: A ROS was performed and pertinent positives and negatives are included in the history.  GENERAL: No fevers or chills. HEENT: No change in vision, no earache, sore throat or sinus congestion. NECK: No pain or stiffness. CARDIOVASCULAR: No chest pain or pressure. No palpitations. PULMONARY: No shortness of breath, cough or wheeze. GASTROINTESTINAL: No abdominal pain, nausea, vomiting or diarrhea, melena or bright red blood per rectum. GENITOURINARY: No urinary frequency, urgency, hesitancy or dysuria. MUSCULOSKELETAL: No joint or muscle pain, no back pain, no recent trauma. DERMATOLOGIC: No rash, no itching, no lesions. ENDOCRINE: No polyuria, polydipsia, no heat or cold intolerance. No recent change in weight. HEMATOLOGICAL: No anemia or easy bruising or bleeding. NEUROLOGIC: No headache, seizures, numbness,  tingling or weakness. PSYCHIATRIC: No depression, no loss of interest in normal activity or change in sleep pattern.     Exam:   BP 112/70   Pulse 66   Ht 5' 4.75" (1.645 m)   Wt 211 lb (95.7 kg)   LMP 01/08/2019 Comment: spotting  SpO2 99%   BMI 35.38 kg/m   Body mass index is 35.38 kg/m.  General appearance : Well developed well nourished female. No acute distress HEENT: Eyes: no retinal hemorrhage or exudates,  Neck supple, trachea midline, no carotid bruits, no thyroidmegaly Lungs: Clear to auscultation, no rhonchi or wheezes, or rib retractions  Heart: Regular rate and rhythm, no murmurs or gallops Breast:Examined in sitting and supine position were symmetrical in appearance, no palpable masses or tenderness,  no skin retraction, no nipple inversion, no nipple discharge, no skin discoloration, no axillary or supraclavicular lymphadenopathy Abdomen: no palpable masses or tenderness, no rebound or guarding Extremities: no edema or skin discoloration or tenderness  Pelvic: Vulva: Normal             Vagina: No gross lesions or discharge  Cervix: No gross lesions or discharge  Uterus  AV, normal size, shape and consistency, non-tender and mobile  Adnexa  Without masses or tenderness  Anus: Irritation with whitening and erythema.  No fissure or hemorrhoid seen.  U/A: Yellow clear, Pro Neg, Nit Neg, WBC 0-5, RBC Neg, Bacteria Neg.  U. Culture pending.   Assessment/Plan:  51 y.o. female for annual exam   1. Well female exam with routine gynecological exam Postmenopause, well on no HRT.  No PMB.  Warm at the end of the night.  No pelvic pain.  Uses coconut oil for intercourse, no pain. Pap Neg in 10/2020.  ASCUS/HPV HR Neg in 2020.  Will repeat Pap at 2-3 years. Urine normal, except for mild dysuria. Bowel movements normal. C/O perianal itching. Breasts normal. Colono 2020.  Mammo Neg 04/2022.  Health labs with family physician.  Body mass index 35.38.  Exercises at home with husband  regularly.   2. Postmenopause Postmenopause, well on no HRT.  No PMB.  Warm at the end of the night.  No pelvic pain.  Uses coconut oil for intercourse, no pain.  3. Irritation of skin of perianal region Dermatitis in the perianal area.  Precaution to avoid further irritation, recommend using baby wipes after BMs.  Will treat with Clobetasol 0.05% ointment. Usage reviewed and prescription sent to pharmacy.  4. Pain with urination U/A Neg.  Reassured. - Urinalysis,Complete w/RFL Culture  Other orders - Urine Culture - REFLEXIVE URINE CULTURE - clobetasol ointment (TEMOVATE) 0.05 %; Apply 1 Application topically 2 (two) times daily. Thin application on affected peri-anal skin twice a day x 3 days, then once a day x 1 to 2 weeks, then slowly wean over 1-2 weeks.   Genia Del MD, 2:02 PM 04/26/2022

## 2022-04-28 LAB — URINALYSIS, COMPLETE W/RFL CULTURE
Bacteria, UA: NONE SEEN /HPF
Bilirubin Urine: NEGATIVE
Glucose, UA: NEGATIVE
Hgb urine dipstick: NEGATIVE
Hyaline Cast: NONE SEEN /LPF
Ketones, ur: NEGATIVE
Nitrites, Initial: NEGATIVE
Protein, ur: NEGATIVE
RBC / HPF: NONE SEEN /HPF (ref 0–2)
Specific Gravity, Urine: 1.015 (ref 1.001–1.035)
pH: 5 (ref 5.0–8.0)

## 2022-04-28 LAB — URINE CULTURE
MICRO NUMBER:: 13750283
SPECIMEN QUALITY:: ADEQUATE

## 2022-04-28 LAB — CULTURE INDICATED
